# Patient Record
Sex: Male | Born: 1995 | Race: White | Hispanic: No | Marital: Single | Smoking: Never smoker
Health system: Southern US, Community
[De-identification: ages and names within clinical notes are randomized; demographics above are authoritative.]

## PROBLEM LIST (undated history)

## (undated) DIAGNOSIS — G43909 Migraine, unspecified, not intractable, without status migrainosus: Secondary | ICD-10-CM

---
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
## 2017-10-29 IMAGING — CT CT HEAD W/O CM
1 series · 16 of 30 positions shown, 20 images · non-contrast
Comparison: Prior CT from 11/09/2014.

CLINICAL DATA: Initial evaluation for acute seizure.

EXAM:
CT HEAD WITHOUT CONTRAST
TECHNIQUE: Contiguous axial images were obtained from the base of the skull
through the vertex without intravenous contrast.

[Series 2: head wo · axial · 0.41mm/px · z∈[+485,+629]mm · 16 of 36 slices shown, 20 images]
[im 2/36  brain]
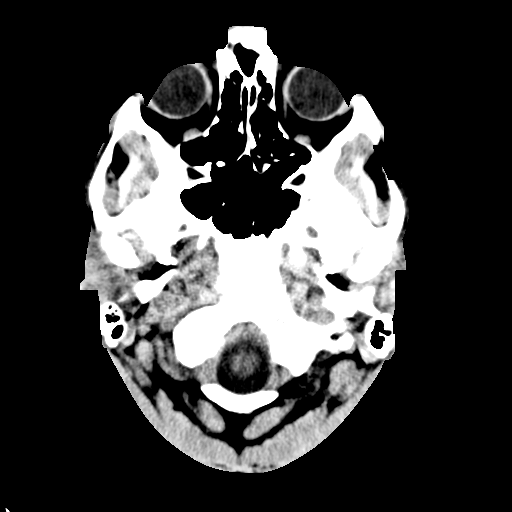
[im 2/36  bone]
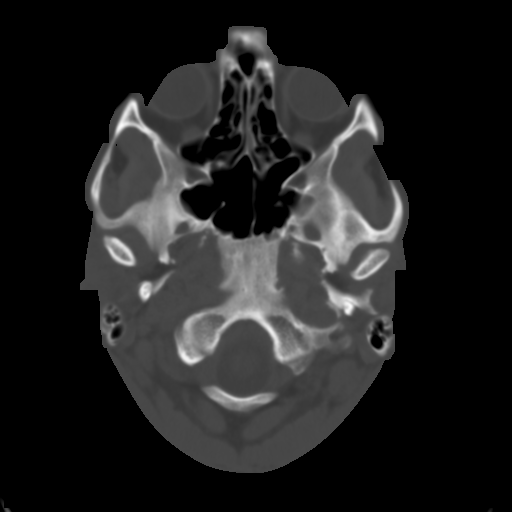
[im 4/36  brain]
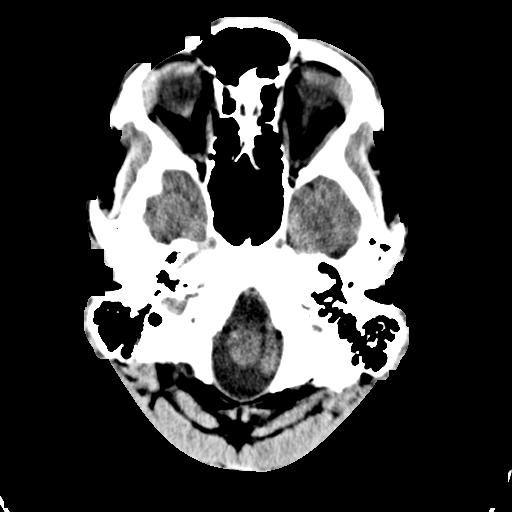
[im 7/36  brain]
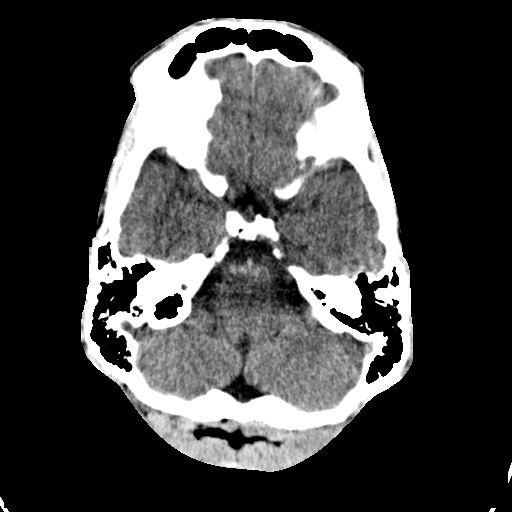
[im 9/36  brain]
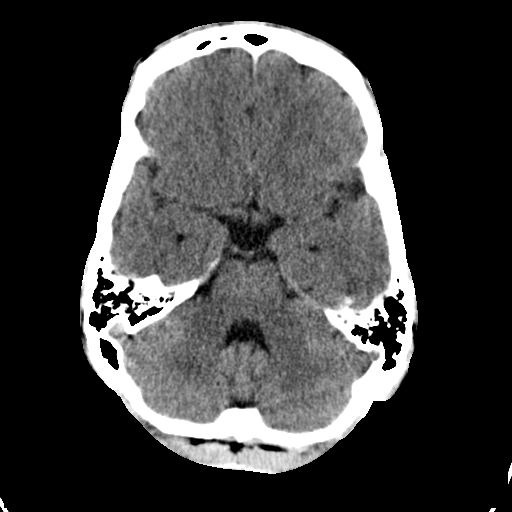
[im 10/36  brain]
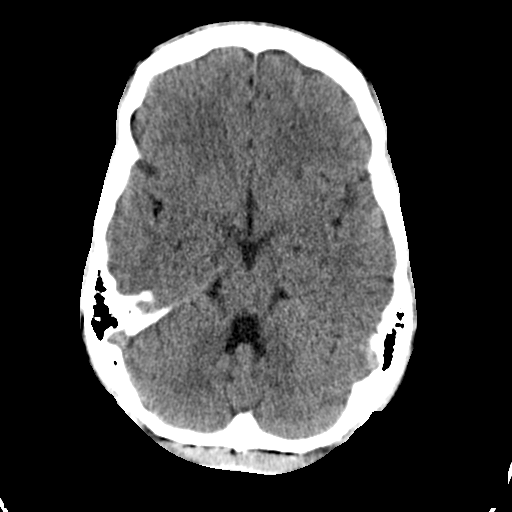
[im 10/36  bone]
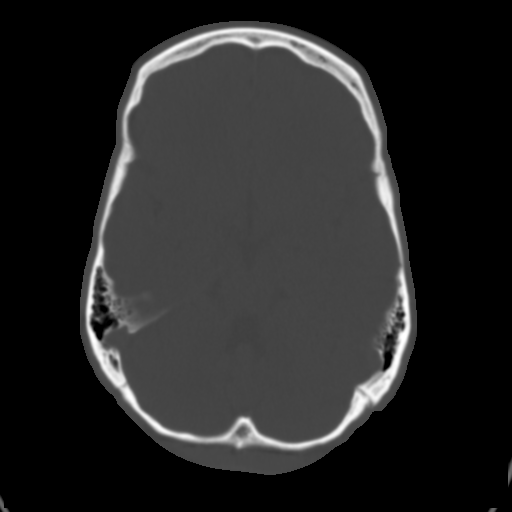
[im 13/36  brain]
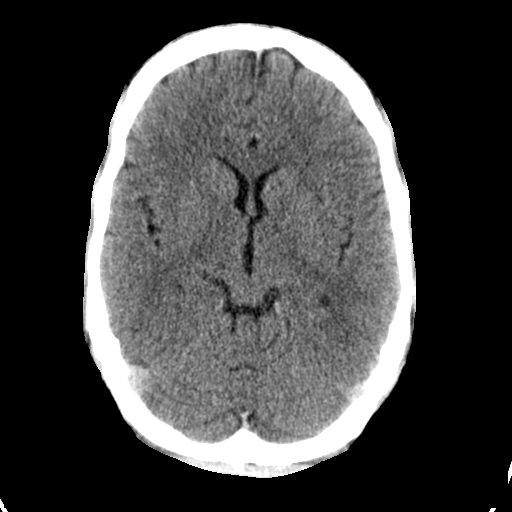
[im 15/36  brain]
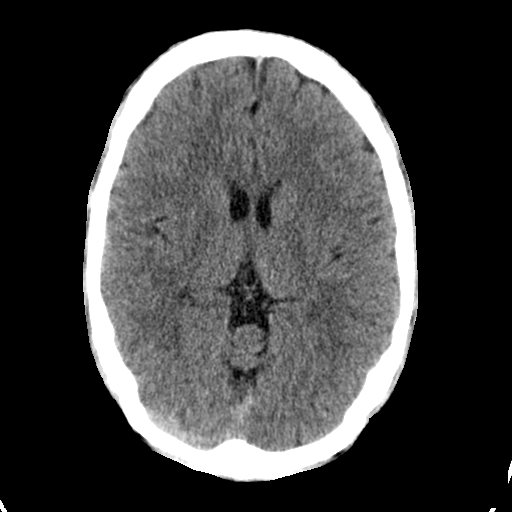
[im 17/36  brain]
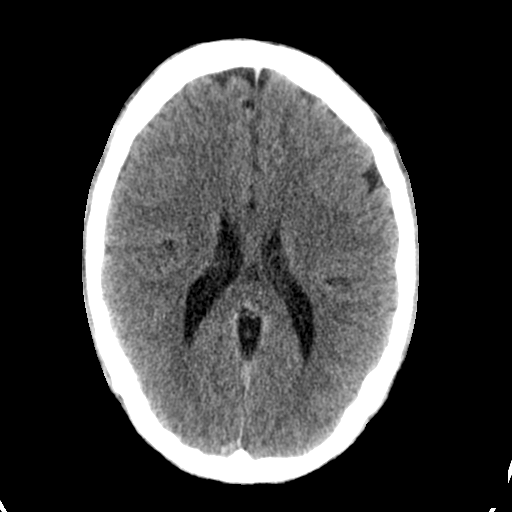
[im 19/36  brain]
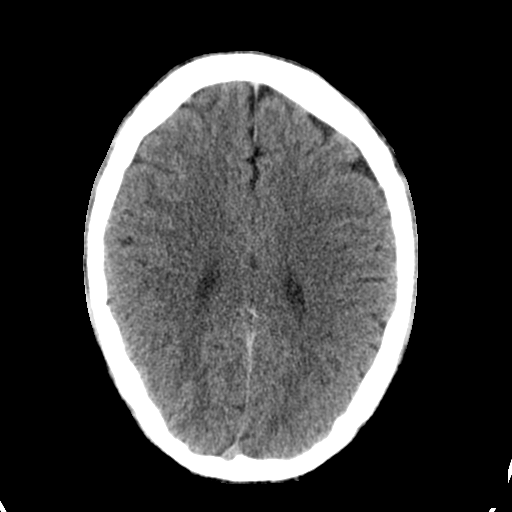
[im 19/36  bone]
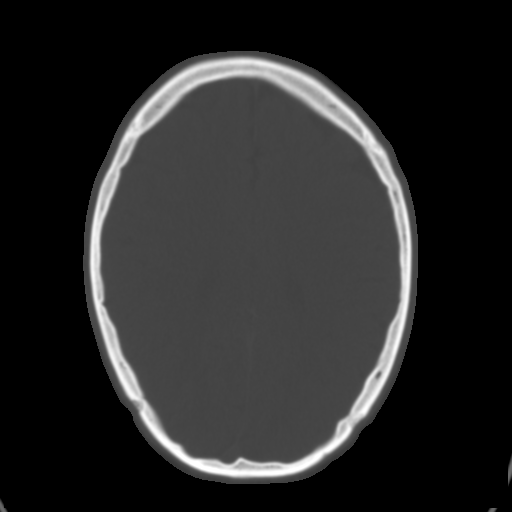
[im 21/36  brain]
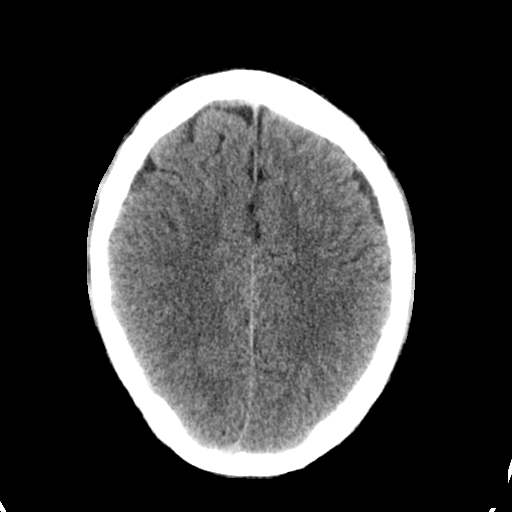
[im 23/36  brain]
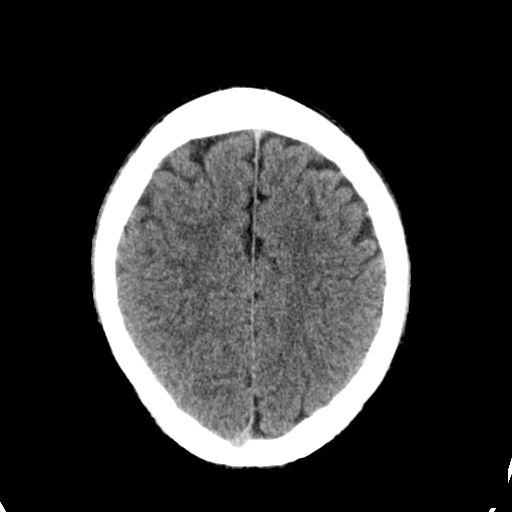
[im 26/36  brain]
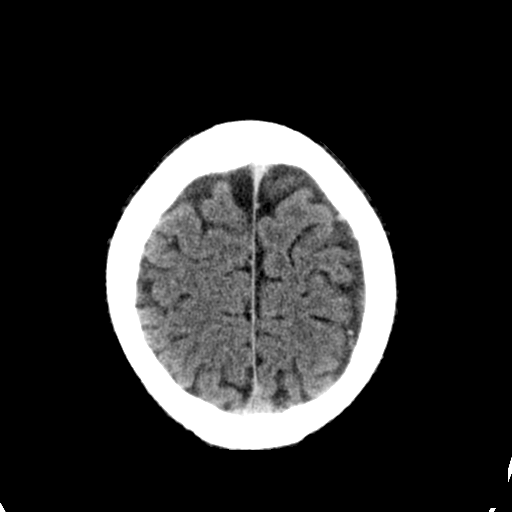
[im 27/36  brain]
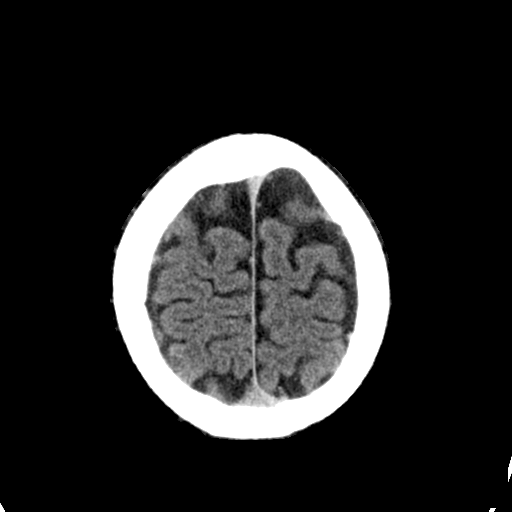
[im 27/36  bone]
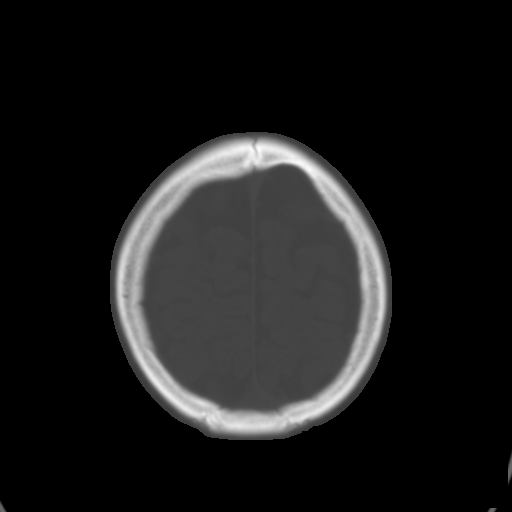
[im 29/36  brain]
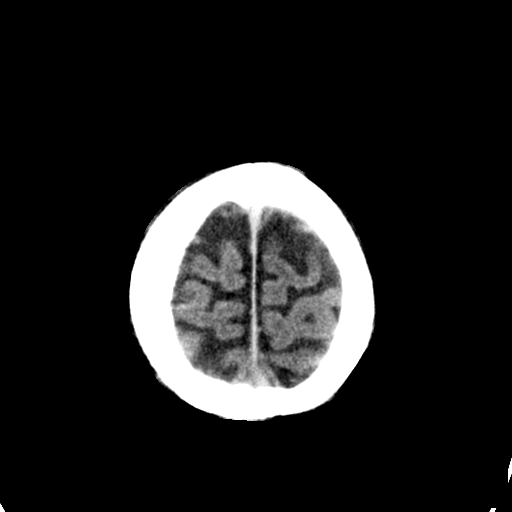
[im 32/36  brain]
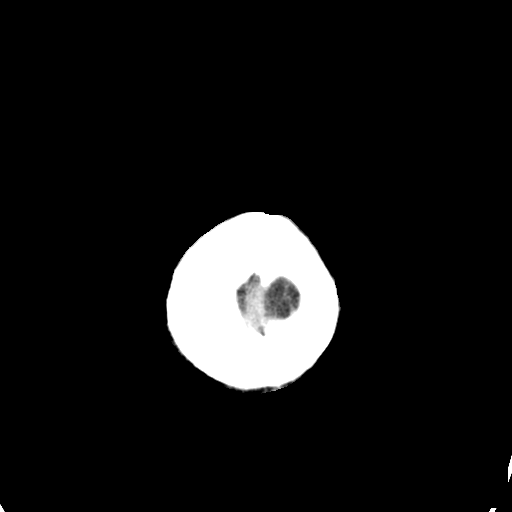
[im 34/36  brain]
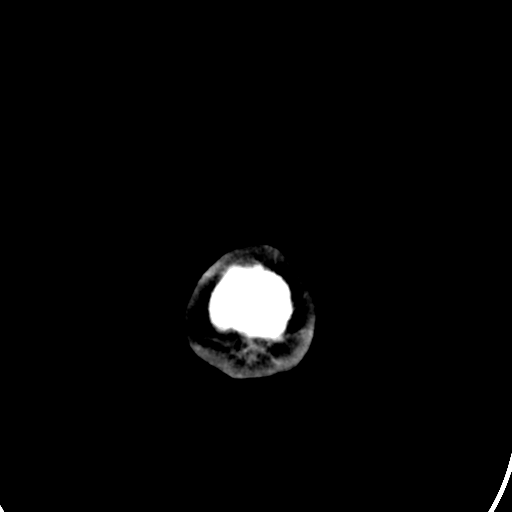

[16 of 30 positions shown; findings below may reference images not displayed]

FINDINGS: There is no acute intracranial hemorrhage or infarct. No mass lesion
or midline shift. Gray-white matter differentiation is well
maintained. Ventricles are normal in size without evidence of
hydrocephalus. CSF containing spaces are within normal limits. No
extra-axial fluid collection.

The calvarium is intact.

Orbital soft tissues are within normal limits.

Mild scattered mucosal thickening within the ethmoidal air cells.
Paranasal sinuses are otherwise clear. No mastoid effusion.

Scalp soft tissues are unremarkable.
IMPRESSION: Normal head CT.  No acute intracranial process identified.

## 2018-02-12 ENCOUNTER — Other Ambulatory Visit
Admission: RE | Admit: 2018-02-12 | Discharge: 2018-02-12 | Disposition: A | Discharge status: Home / Self Care | Payer: 59 | Source: Ambulatory Visit | Attending: Neurology | Admitting: Neurology

## 2018-02-12 DIAGNOSIS — G40309 Generalized idiopathic epilepsy and epileptic syndromes, not intractable, without status epilepticus: Secondary | ICD-10-CM | POA: Diagnosis not present

## 2018-02-12 LAB — AMMONIA: Ammonia: 46 umol/L — ABNORMAL HIGH (ref 9–35)

## 2018-03-20 IMAGING — US US ABDOMEN LIMITED
1 series · 14 of 25 positions shown · non-contrast
Comparison: None in PACs

CLINICAL DATA: Elevated liver enzymes

EXAM:
US ABDOMEN LIMITED - RIGHT UPPER QUADRANT

[Series 1: us abdomen limited · 0.15mm/px · 14 of 63 slices shown]
[im 1/63]
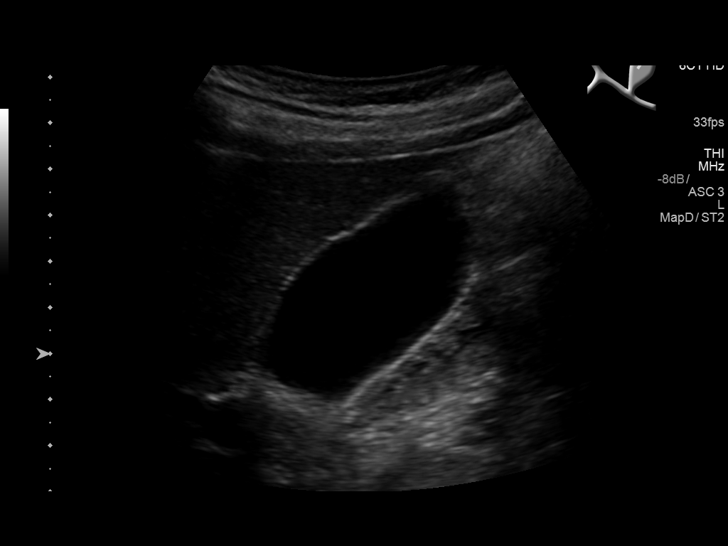
[im 6/63]
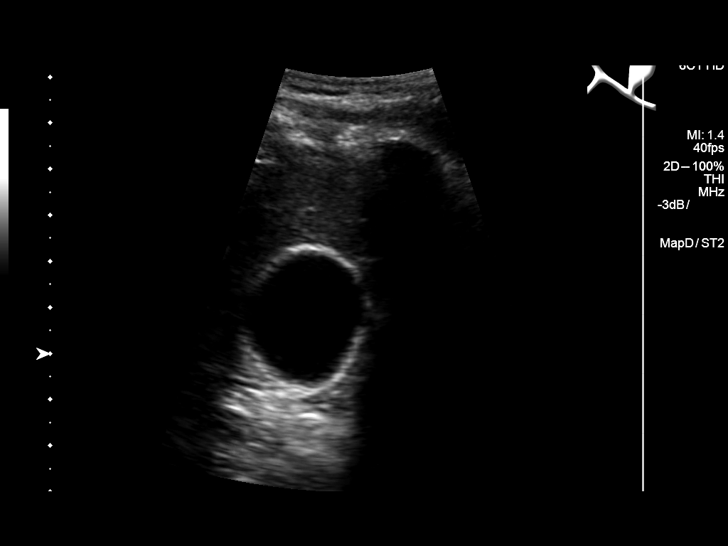
[im 11/63]
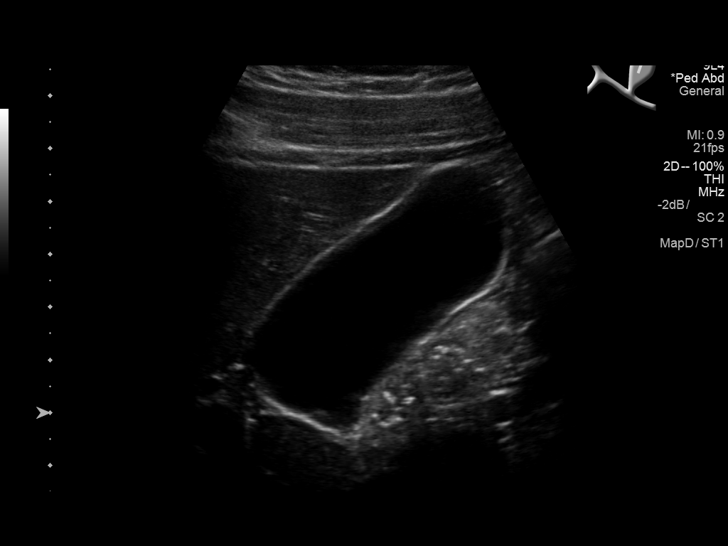
[im 16/63]
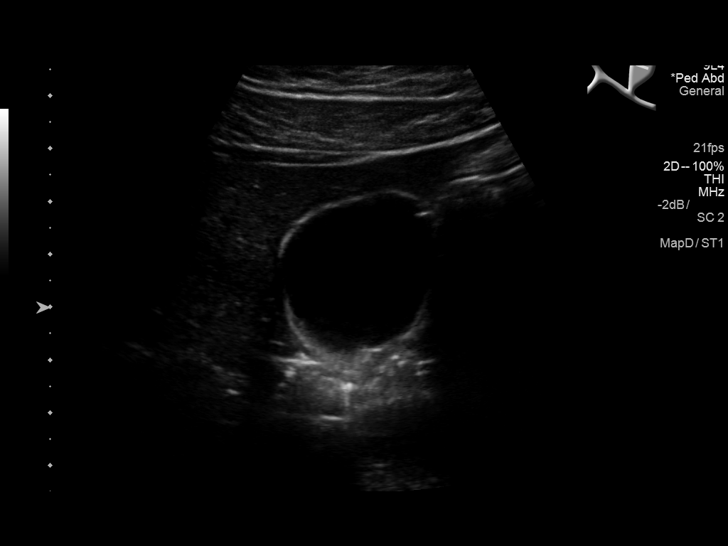
[im 21/63]
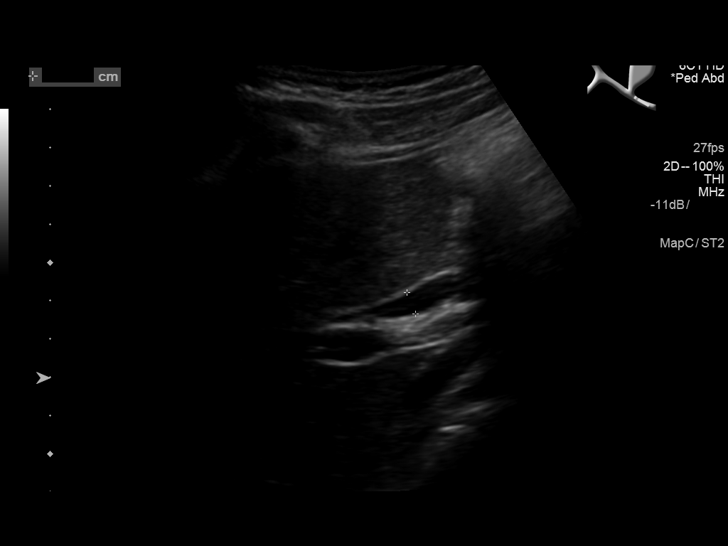
[im 24/63]
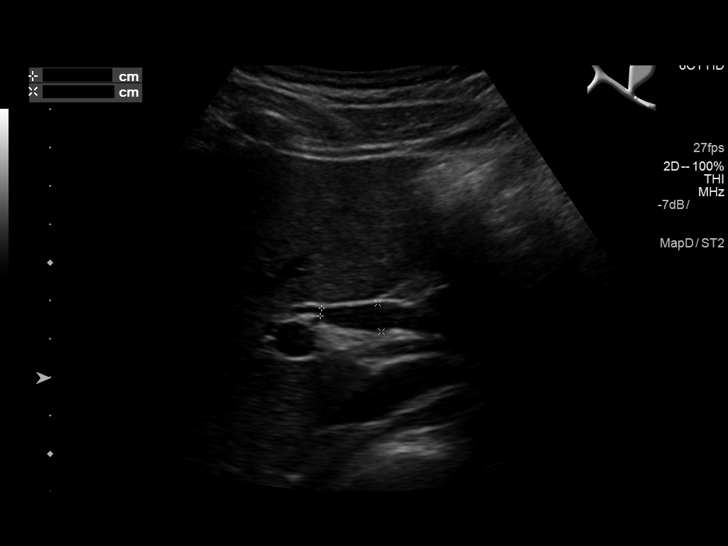
[im 29/63]
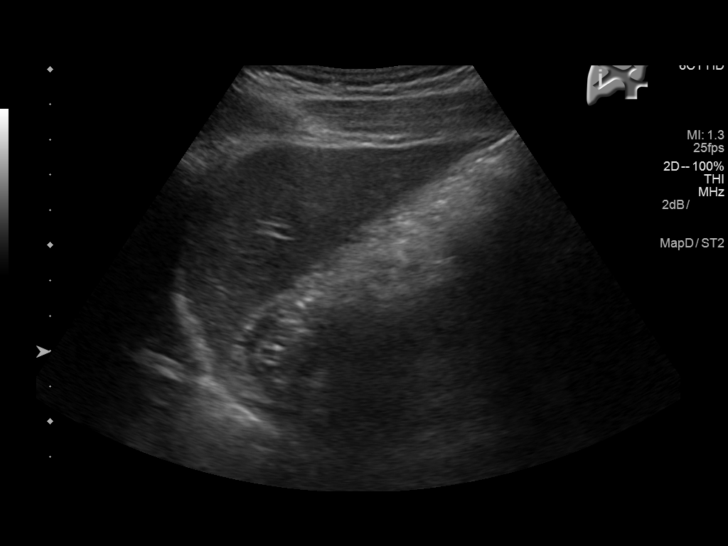
[im 34/63]
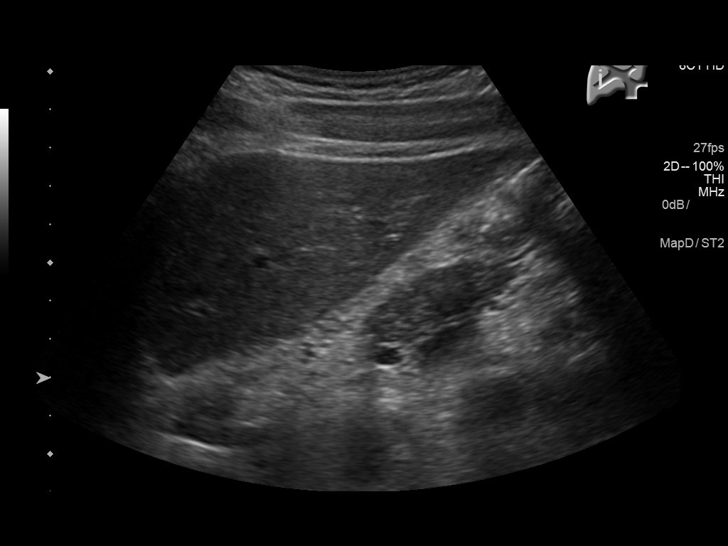
[im 39/63]
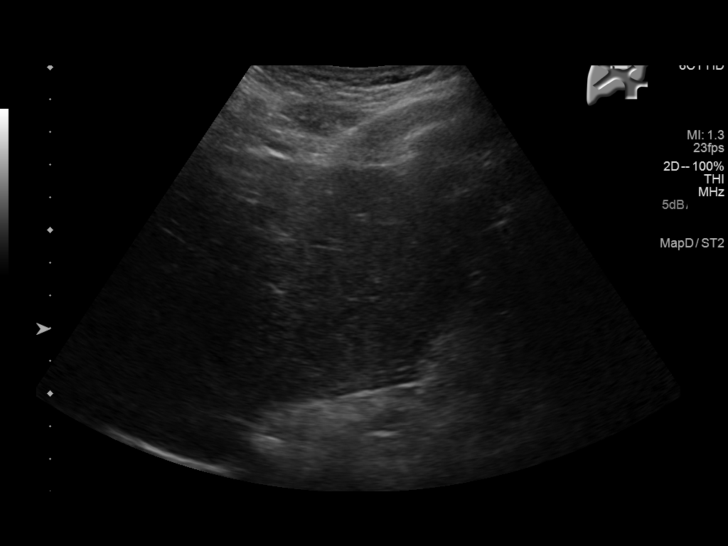
[im 42/63]
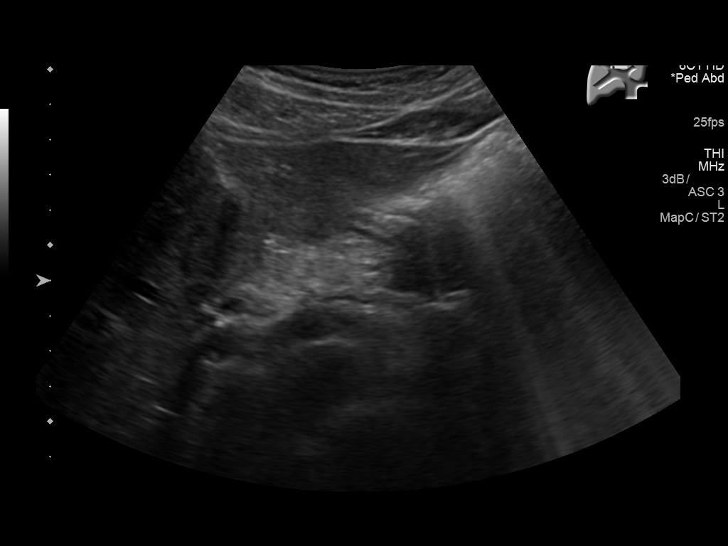
[im 47/63]
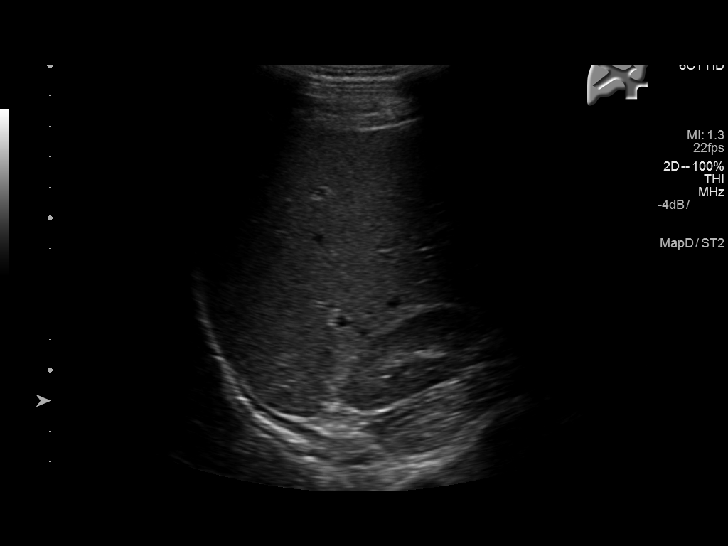
[im 52/63]
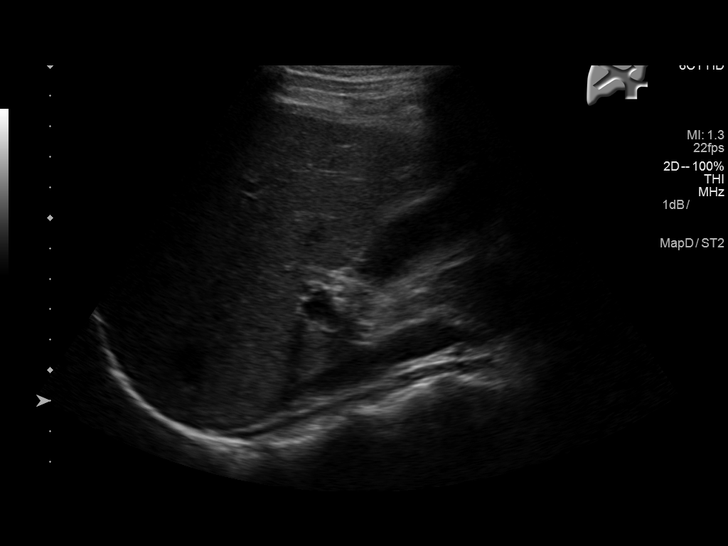
[im 57/63]
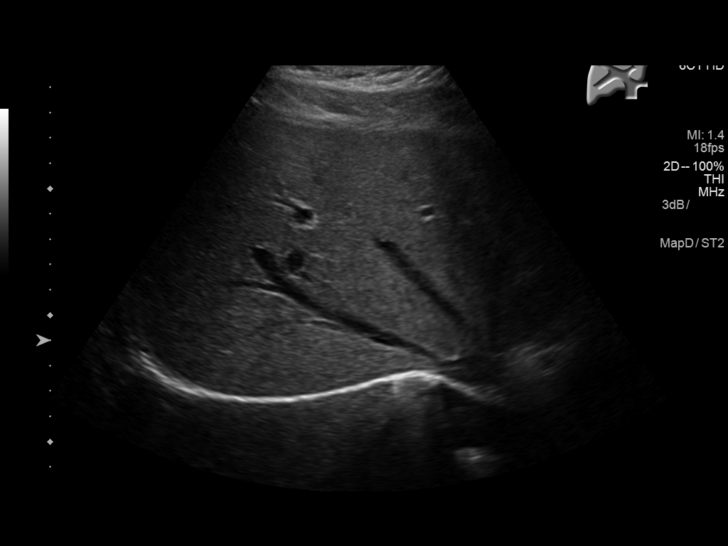
[im 63/63]
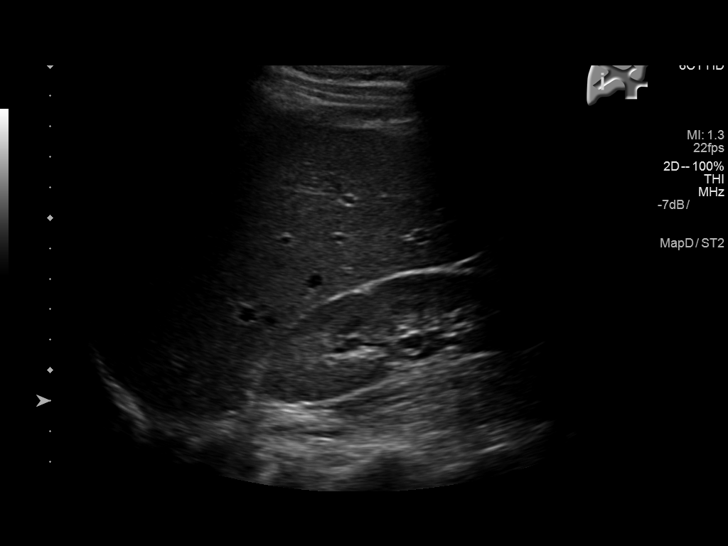

[14 of 25 positions shown; findings below may reference images not displayed]

FINDINGS: Gallbladder:

The gallbladder is adequately distended with no evidence of stones,
wall thickening, or pericholecystic fluid. There is no positive
sonographic Murphy's sign.

Common bile duct:

Diameter: The common bile duct is dilated distally to 7.5 mm. No
intraluminal stones are observed.

Liver:

No focal lesion identified. Within normal limits in parenchymal
echogenicity.
IMPRESSION: Mildly dilated common bile duct to 7.5 mm distally without evidence
of intraluminal stones. No hepatic or gallbladder abnormality is
observed.

If gallbladder dysfunction is suspected clinically, a nuclear
medicine hepatobiliary scan may be useful.

## 2023-08-11 ENCOUNTER — Ambulatory Visit
Admission: EM | Admit: 2023-08-11 | Discharge: 2023-08-11 | Disposition: A | Discharge status: Home / Self Care | Payer: BC Managed Care – PPO | Attending: Emergency Medicine | Admitting: Emergency Medicine

## 2023-08-11 ENCOUNTER — Encounter: Payer: Self-pay | Admitting: Emergency Medicine

## 2023-08-11 DIAGNOSIS — J069 Acute upper respiratory infection, unspecified: Secondary | ICD-10-CM | POA: Diagnosis present

## 2023-08-11 LAB — GROUP A STREP BY PCR: Group A Strep by PCR: NOT DETECTED

## 2023-08-11 MED ORDER — PROMETHAZINE-DM 6.25-15 MG/5ML PO SYRP: 5.0000 mL | ORAL_SOLUTION | Freq: Four times a day (QID) | ORAL | 0 refills | Status: AC | PRN

## 2023-08-11 MED ORDER — BENZONATATE 100 MG PO CAPS: 200.0000 mg | ORAL_CAPSULE | Freq: Three times a day (TID) | ORAL | 0 refills | Status: AC

## 2023-08-11 MED ORDER — IPRATROPIUM BROMIDE 0.06 % NA SOLN: 2.0000 | Freq: Four times a day (QID) | NASAL | 12 refills | Status: AC

## 2023-08-11 NOTE — Discharge Instructions (Addendum)
Your strep test was negative.  I do believe that your sore throat is being caused by your postnasal drip.  Use over-the-counter Tylenol and or ibuprofen according to package instructions as needed for pain.  Use the Atrovent nasal spray, 2 squirts in each nostril every 6 hours, as needed for runny nose and postnasal drip.  Use the Tessalon Perles every 8 hours during the day.  Take them with a small sip of water.  They may give you some numbness to the base of your tongue or a metallic taste in your mouth, this is normal.  Use the Promethazine DM cough syrup at bedtime for cough and congestion.  It will make you drowsy so do not take it during the day.  Return for reevaluation or see your primary care provider for any new or worsening symptoms.

## 2023-08-11 NOTE — ED Provider Notes (Signed)
[facility]    CSN: [id number] Arrival date & time: 08/11/23  1321      History   Chief Complaint Chief Complaint  Patient presents with   Sore Throat   Cough   Hoarse    HPI Anthony Cannon is a 27 y.o. male.   HPI  27 year old male with a past medical history significant for migraines presents for evaluation of sore throat and cough that been going on for the past week.  He does endorse some runny nose and nasal congestion and states that his cough is intermittently productive for clear sputum.  He has not had a fever and he denies any ear pain, shortness breath, or wheezing.  No known sick contacts.  Past Medical History:  Diagnosis Date   Migraines     There are no problems to display for this patient.   History reviewed. No pertinent surgical history.     Home Medications    Prior to Admission medications   Medication Sig Start Date End Date Taking? Authorizing Provider  benzonatate (TESSALON) 100 MG capsule Take 2 capsules (200 mg total) by mouth every 8 (eight) hours. 08/11/23  Yes Becky Augusta, NP  divalproex (DEPAKOTE) 250 MG DR tablet Take 750 mg each morning and 500 mg each night 08/11/18  Yes [provider]  ipratropium (ATROVENT) 0.06 % nasal spray Place 2 sprays into both nostrils 4 (four) times daily. 08/11/23  Yes Becky Augusta, NP  LORazepam (ATIVAN) 1 MG tablet Take by mouth. 11/25/18  Yes [provider]  promethazine-dextromethorphan (PROMETHAZINE-DM) 6.25-15 MG/5ML syrup Take 5 mLs by mouth 4 (four) times daily as needed. 08/11/23  Yes Becky Augusta, NP  magnesium oxide (MAG-OX) 400 MG tablet Take 400 mg by mouth daily.    [provider]    Family History History reviewed. No pertinent family history.  Social History Social History   Tobacco Use   Smoking status: Never   Smokeless tobacco: Never  Vaping Use   Vaping status: Never Used  Substance Use Topics   Alcohol use: No   Drug use: No      Allergies   Clobazam, Lamotrigine, and Levetiracetam   Review of Systems Review of Systems  Constitutional:  Negative for fever.  HENT:  Positive for congestion, postnasal drip and sore throat. Negative for ear pain.   Respiratory:  Positive for cough. Negative for shortness of breath and wheezing.      Physical Exam Triage Vital Signs ED Triage Vitals [08/11/23 1356]  Encounter Vitals Group     BP      Systolic BP Percentile      Diastolic BP Percentile      Pulse      Resp      Temp      Temp src      SpO2      Weight      Height      Head Circumference      Peak Flow      Pain Score 7     Pain Loc      Pain Education      Exclude from Growth Chart    No data found.  Updated Vital Signs BP 110/72 (BP Location: Left Arm)   Pulse 78   Temp 98.3 F (36.8 C)   Resp 16   SpO2 98%   Visual Acuity Right Eye Distance:   Left Eye Distance:   Bilateral Distance:    Right Eye Near:  Left Eye Near:    Bilateral Near:     Physical Exam Vitals and nursing note reviewed.  Constitutional:      Appearance: Normal appearance. He is not ill-appearing.  HENT:     Head: Normocephalic and atraumatic.     Right Ear: Tympanic membrane, ear canal and external ear normal. There is no impacted cerumen.     Left Ear: Tympanic membrane, ear canal and external ear normal. There is no impacted cerumen.     Nose: Congestion and rhinorrhea present.     Comments: Chest is erythematous and edematous with clear discharge in both nares.    Mouth/Throat:     Mouth: Mucous membranes are moist.     Pharynx: Oropharynx is clear. Posterior oropharyngeal erythema present. No oropharyngeal exudate.     Comments: Tonsillar pillars are unremarkable.  Posterior oropharynx demonstrates erythema with injection and clear postnasal drip. Cardiovascular:     Rate and Rhythm: Normal rate and regular rhythm.     Pulses: Normal pulses.     Heart sounds: Normal heart sounds. No murmur  heard.    No friction rub. No gallop.  Pulmonary:     Effort: Pulmonary effort is normal.     Breath sounds: Normal breath sounds. No wheezing, rhonchi or rales.  Musculoskeletal:     Cervical back: Normal range of motion and neck supple.  Lymphadenopathy:     Cervical: No cervical adenopathy.  Skin:    General: Skin is warm and dry.     Capillary Refill: Capillary refill takes less than 2 seconds.     Findings: No rash.  Neurological:     General: No focal deficit present.     Mental Status: He is alert and oriented to person, place, and time.      UC Treatments / Results  Labs (all labs ordered are listed, but only abnormal results are displayed) Labs Reviewed  GROUP A STREP BY PCR    EKG   Radiology No results found.  Procedures Procedures (including critical care time)  Medications Ordered in UC Medications - No data to display  Initial Impression / Assessment and Plan / UC Course  I have reviewed the triage vital signs and the nursing notes.  Pertinent labs & imaging results that were available during my care of the patient were reviewed by me and considered in my medical decision making (see chart for details).   Patient is a pleasant, nontoxic-appearing 27 year old male presenting for evaluation of sore throat cough as outlined HPI above.  On exam he does have inflamed nasal mucosa with clear rhinorrhea and clear postnasal drip.  I think this is the cause of his sore throat and his cough and his lungs are clear to auscultation all fields.  He reports that his cough is only occasionally productive for clear sputum and he has no shortness breath or wheezing.  Given that he has had a sore throat for a week with mild erythema to the posterior oropharynx I will obtain a strep PCR.  If strep PCR is negative I will discharge him home with a diagnosis of viral URI with cough.  PCR is negative.  I will discharge patient with diagnosis of viral URI with a cough  intravaginal nasal spray, Tessalon Perles, Promethazine DM cough syrup.   Final Clinical Impressions(s) / UC Diagnoses   Final diagnoses:  Viral URI with cough     Discharge Instructions      Your strep test was negative.  I do  believe that your sore throat is being caused by your postnasal drip.  Use over-the-counter Tylenol and or ibuprofen according to package instructions as needed for pain.  Use the Atrovent nasal spray, 2 squirts in each nostril every 6 hours, as needed for runny nose and postnasal drip.  Use the Tessalon Perles every 8 hours during the day.  Take them with a small sip of water.  They may give you some numbness to the base of your tongue or a metallic taste in your mouth, this is normal.  Use the Promethazine DM cough syrup at bedtime for cough and congestion.  It will make you drowsy so do not take it during the day.  Return for reevaluation or see your primary care provider for any new or worsening symptoms.      ED Prescriptions     Medication Sig Dispense Auth. Provider   benzonatate (TESSALON) 100 MG capsule Take 2 capsules (200 mg total) by mouth every 8 (eight) hours. 21 capsule Becky Augusta, NP   ipratropium (ATROVENT) 0.06 % nasal spray Place 2 sprays into both nostrils 4 (four) times daily. 15 mL Becky Augusta, NP   promethazine-dextromethorphan (PROMETHAZINE-DM) 6.25-15 MG/5ML syrup Take 5 mLs by mouth 4 (four) times daily as needed. 118 mL Becky Augusta, NP      PDMP not reviewed this encounter.   Becky Augusta, NP 08/11/23 1435

## 2023-08-11 NOTE — ED Triage Notes (Signed)
Pt c/o sore throat and cough x 1 week. OTC medication is not helping with his symptoms.
